# Patient Record
Sex: Female | Born: 1985 | Race: White | Hispanic: No | Marital: Single | Smoking: Current every day smoker
Health system: Southern US, Community
[De-identification: ages and names within clinical notes are randomized; demographics above are authoritative.]

## PROBLEM LIST (undated history)

## (undated) DIAGNOSIS — I10 Essential (primary) hypertension: Secondary | ICD-10-CM

## (undated) DIAGNOSIS — J189 Pneumonia, unspecified organism: Secondary | ICD-10-CM

---

## 2016-10-20 DIAGNOSIS — Z98891 History of uterine scar from previous surgery: Secondary | ICD-10-CM | POA: Insufficient documentation

## 2016-12-16 DIAGNOSIS — I10 Essential (primary) hypertension: Secondary | ICD-10-CM | POA: Insufficient documentation

## 2018-03-05 ENCOUNTER — Encounter (HOSPITAL_COMMUNITY): Payer: Self-pay

## 2018-03-05 DIAGNOSIS — F17200 Nicotine dependence, unspecified, uncomplicated: Secondary | ICD-10-CM | POA: Diagnosis not present

## 2018-03-05 DIAGNOSIS — Y929 Unspecified place or not applicable: Secondary | ICD-10-CM | POA: Insufficient documentation

## 2018-03-05 DIAGNOSIS — Y999 Unspecified external cause status: Secondary | ICD-10-CM | POA: Diagnosis not present

## 2018-03-05 DIAGNOSIS — J069 Acute upper respiratory infection, unspecified: Secondary | ICD-10-CM | POA: Diagnosis not present

## 2018-03-05 DIAGNOSIS — X58XXXA Exposure to other specified factors, initial encounter: Secondary | ICD-10-CM | POA: Insufficient documentation

## 2018-03-05 DIAGNOSIS — S90454A Superficial foreign body, right lesser toe(s), initial encounter: Secondary | ICD-10-CM | POA: Diagnosis present

## 2018-03-05 DIAGNOSIS — L03031 Cellulitis of right toe: Secondary | ICD-10-CM | POA: Diagnosis not present

## 2018-03-05 DIAGNOSIS — Y939 Activity, unspecified: Secondary | ICD-10-CM | POA: Diagnosis not present

## 2018-03-05 NOTE — ED Triage Notes (Signed)
Pt complains of flulike sx for two days, she also states that she has a toe ring on her right second toe that is now infected and embedded in her toe

## 2018-03-06 ENCOUNTER — Emergency Department (HOSPITAL_COMMUNITY)
Admission: EM | Admit: 2018-03-06 | Discharge: 2018-03-06 | Disposition: A | Discharge status: Home / Self Care | Payer: Medicaid Other | Attending: Emergency Medicine | Admitting: Emergency Medicine

## 2018-03-06 ENCOUNTER — Emergency Department (HOSPITAL_COMMUNITY): Payer: Medicaid Other

## 2018-03-06 DIAGNOSIS — J069 Acute upper respiratory infection, unspecified: Secondary | ICD-10-CM

## 2018-03-06 DIAGNOSIS — L03031 Cellulitis of right toe: Secondary | ICD-10-CM

## 2018-03-06 MED ORDER — SULFAMETHOXAZOLE-TRIMETHOPRIM 800-160 MG PO TABS
1.0000 | ORAL_TABLET | Freq: Once | ORAL | Status: AC
  Administered 2018-03-06: 1 via ORAL
  Filled 2018-03-06: qty 1

## 2018-03-06 MED ORDER — SULFAMETHOXAZOLE-TRIMETHOPRIM 800-160 MG PO TABS: 1.0000 | ORAL_TABLET | Freq: Two times a day (BID) | ORAL | 0 refills | Status: AC

## 2018-03-06 MED ORDER — LIDOCAINE HCL 2 % IJ SOLN
20.0000 mL | Freq: Once | INTRAMUSCULAR | Status: AC
  Administered 2018-03-06: 400 mg
  Filled 2018-03-06: qty 20

## 2018-03-06 MED ORDER — BACITRACIN ZINC 500 UNIT/GM EX OINT
TOPICAL_OINTMENT | CUTANEOUS | Status: AC
  Filled 2018-03-06: qty 1.8

## 2018-03-06 NOTE — ED Provider Notes (Signed)
[facility]-EMERGENCY DEPT Provider Note   CSN: [id number] Arrival date & time: 03/05/18  2128     History   Chief Complaint Chief Complaint  Patient presents with  . flulike sx  . Toe Pain    HPI Susan Duran is a 32 y.o. female.  Patient presents with 2 separate complaints.  Patient reports that she has been experiencing fever, chills, malaise, myalgias with nasal congestion and cough for 5 days.  She had a couple of episodes of diarrhea early in the process, but this has cleared.  No nausea, vomiting, abdominal pain.  Also complaining of right second toe pain.  Patient reports that she stubbed her toe several months ago.  She wears a ring on the right second toe, has noticed progressive swelling of the area and now the ring has become embedded in the skin of the toe.  She is concerned that it is infected.     History reviewed. No pertinent past medical history.  There are no active problems to display for this patient.   History reviewed. No pertinent surgical history.   OB History   None      Home Medications    Prior to Admission medications   Medication Sig Start Date End Date Taking? Authorizing Provider  acetaminophen (TYLENOL) 500 MG tablet Take 1,000 mg by mouth every 6 (six) hours as needed for moderate pain.   Yes [provider]  sulfamethoxazole-trimethoprim (BACTRIM DS,SEPTRA DS) 800-160 MG tablet Take 1 tablet by mouth 2 (two) times daily for 7 days. 03/06/18 03/13/18  Gilda Crease, MD    Family History History reviewed. No pertinent family history.  Social History Social History   Tobacco Use  . Smoking status: Current Every Day Smoker  . Smokeless tobacco: Never Used  Substance Use Topics  . Alcohol use: Never    Frequency: Never  . Drug use: Never     Allergies   Patient has no known allergies.   Review of Systems Review of Systems  Constitutional: Positive for fever.  HENT: Positive for  congestion.   Respiratory: Positive for cough.   Musculoskeletal: Positive for myalgias.  Skin: Positive for wound.  All other systems reviewed and are negative.    Physical Exam Updated Vital Signs BP 132/68   Pulse 89   Temp 98.6 F (37 C) (Oral)   Resp 18   SpO2 100%   Physical Exam  Constitutional: She is oriented to person, place, and time. She appears well-developed and well-nourished. No distress.  HENT:  Head: Normocephalic and atraumatic.  Right Ear: Hearing normal.  Left Ear: Hearing normal.  Nose: Nose normal.  Mouth/Throat: Oropharynx is clear and moist and mucous membranes are normal.  Eyes: Pupils are equal, round, and reactive to light. Conjunctivae and EOM are normal.  Neck: Normal range of motion. Neck supple.  Cardiovascular: Regular rhythm, S1 normal and S2 normal. Exam reveals no gallop and no friction rub.  No murmur heard. Pulmonary/Chest: Effort normal and breath sounds normal. No respiratory distress. She exhibits no tenderness.  Abdominal: Soft. Normal appearance and bowel sounds are normal. There is no hepatosplenomegaly. There is no tenderness. There is no rebound, no guarding, no tenderness at McBurney's point and negative Murphy's sign. No hernia.  Musculoskeletal: Normal range of motion.  Neurological: She is alert and oriented to person, place, and time. She has normal strength. No cranial nerve deficit or sensory deficit. Coordination normal. GCS eye subscore is 4. GCS verbal subscore is  5. GCS motor subscore is 6.  Skin: Skin is warm, dry and intact. No rash noted. No cyanosis.  Swelling and erythema of right second toe.  There is a ring present and the ring is completely overgrown by skin on lateral sides of the toe.  Psychiatric: She has a normal mood and affect. Her speech is normal and behavior is normal. Thought content normal.  Nursing note and vitals reviewed.      ED Treatments / Results  Labs (all labs ordered are listed, but only  abnormal results are displayed) Labs Reviewed - No data to display  EKG None  Radiology Dg Chest 2 View  Result Date: 03/06/2018 CLINICAL DATA:  Cough, flu-like symptoms. EXAM: CHEST - 2 VIEW COMPARISON:  None. FINDINGS: Mild peribronchial thickening. Heart and mediastinal contours are within normal limits. No focal opacities or effusions. No acute bony abnormality. IMPRESSION: Mild bronchitic changes. Electronically Signed   By: Charlett Nose M.D.   On: 03/06/2018 02:38   Dg Toe 2nd Right  Result Date: 03/06/2018 CLINICAL DATA:  Ring imbedded in toe. EXAM: RIGHT SECOND TOE COMPARISON:  None. FINDINGS: There is a ring on the right 2nd toe. No visible underlying acute bony abnormality or radiographic changes of osteomyelitis. IMPRESSION: No visible acute bony abnormality. Electronically Signed   By: Charlett Nose M.D.   On: 03/06/2018 02:39    Procedures .Foreign Body Removal Date/Time: 03/06/2018 3:29 AM Performed by: Gilda Crease, MD Authorized by: Gilda Crease, MD  Consent: Verbal consent obtained. Risks and benefits: risks, benefits and alternatives were discussed Consent given by: patient Patient understanding: patient states understanding of the procedure being performed Site marked: the operative site was marked Imaging studies: imaging studies available Patient identity confirmed: verbally with patient Time out: Immediately prior to procedure a "time out" was called to verify the correct patient, procedure, equipment, support staff and site/side marked as required. Body area: skin General location: lower extremity Location details: right second toe Anesthesia: digital block  Anesthesia: Local Anesthetic: lidocaine 2% without epinephrine Anesthetic total: 5 mL  Sedation: Patient sedated: no  Patient restrained: no Patient cooperative: yes Localization method: visualized Tendon involvement: none Complexity: simple 1 objects recovered. Objects  recovered: toe ring Post-procedure assessment: foreign body removed Patient tolerance: Patient tolerated the procedure well with no immediate complications Comments: Ring was cut with a ring cutter and then 2 pieces were removed from the toe   (including critical care time)  Medications Ordered in ED Medications  sulfamethoxazole-trimethoprim (BACTRIM DS,SEPTRA DS) 800-160 MG per tablet 1 tablet (has no administration in time range)  lidocaine (XYLOCAINE) 2 % (with pres) injection 400 mg (400 mg Infiltration Given by Other 03/06/18 1610)     Initial Impression / Assessment and Plan / ED Course  I have reviewed the triage vital signs and the nursing notes.  Pertinent labs & imaging results that were available during my care of the patient were reviewed by me and considered in my medical decision making (see chart for details).     Presents with upper respiratory infection symptoms.  Chest x-ray is clear, no evidence of pneumonia.  Symptoms suggest viral etiology.  No specific treatment necessary.  Patient also concerned about her right second toe.  She wears a toe ring on this toe and the ring has become embedded in the skin and now there is some redness associated.  A digital block was performed and the ring was removed.  Patient instructed on local wound care and  will prescribe a course of Bactrim.  Final Clinical Impressions(s) / ED Diagnoses   Final diagnoses:  Cellulitis of toe of right foot  Upper respiratory tract infection, unspecified type    ED Discharge Orders         Ordered    sulfamethoxazole-trimethoprim (BACTRIM DS,SEPTRA DS) 800-160 MG tablet  2 times daily     03/06/18 0327           Gilda Crease, MD 03/06/18 0330

## 2018-05-08 ENCOUNTER — Emergency Department (HOSPITAL_COMMUNITY)
Admission: EM | Admit: 2018-05-08 | Discharge: 2018-05-08 | Disposition: A | Discharge status: Home / Self Care | Payer: Medicaid Other | Attending: Emergency Medicine | Admitting: Emergency Medicine

## 2018-05-08 ENCOUNTER — Encounter (HOSPITAL_COMMUNITY): Payer: Self-pay

## 2018-05-08 ENCOUNTER — Emergency Department (HOSPITAL_COMMUNITY): Payer: Medicaid Other

## 2018-05-08 DIAGNOSIS — Y92002 Bathroom of unspecified non-institutional (private) residence single-family (private) house as the place of occurrence of the external cause: Secondary | ICD-10-CM | POA: Insufficient documentation

## 2018-05-08 DIAGNOSIS — Y93E1 Activity, personal bathing and showering: Secondary | ICD-10-CM | POA: Diagnosis not present

## 2018-05-08 DIAGNOSIS — X509XXA Other and unspecified overexertion or strenuous movements or postures, initial encounter: Secondary | ICD-10-CM | POA: Diagnosis not present

## 2018-05-08 DIAGNOSIS — F17200 Nicotine dependence, unspecified, uncomplicated: Secondary | ICD-10-CM | POA: Insufficient documentation

## 2018-05-08 DIAGNOSIS — S62636A Displaced fracture of distal phalanx of right little finger, initial encounter for closed fracture: Secondary | ICD-10-CM | POA: Insufficient documentation

## 2018-05-08 DIAGNOSIS — Y999 Unspecified external cause status: Secondary | ICD-10-CM | POA: Insufficient documentation

## 2018-05-08 DIAGNOSIS — S6991XA Unspecified injury of right wrist, hand and finger(s), initial encounter: Secondary | ICD-10-CM | POA: Diagnosis present

## 2018-05-08 MED ORDER — OXYCODONE-ACETAMINOPHEN 5-325 MG PO TABS
1.0000 | ORAL_TABLET | Freq: Once | ORAL | Status: AC
  Administered 2018-05-08: 1 via ORAL
  Filled 2018-05-08: qty 1

## 2018-05-08 MED ORDER — OXYCODONE-ACETAMINOPHEN 5-325 MG PO TABS: 1.0000 | ORAL_TABLET | ORAL | 0 refills | Status: AC | PRN

## 2018-05-08 NOTE — ED Provider Notes (Signed)
[facility]   CSN: [id number] Arrival date & time: 05/08/18  1155     History   Chief Complaint Chief Complaint  Patient presents with  . Finger Injury    HPI Susan Duran is a 32 y.o. female.  32 y/o female with no PMH presents to the ED with a chief complaint of right pinky finger pain x yesterday.Patient reports she was getting out of the shower when she grabbed a towel and the towel wrapped around her right pinky and seeing it "pop". She reports applying an ace bandage along with taking ibuprofen 80 0mg  but states no improvement in symptoms.She denies any other complaints.    The history is provided by the patient.    History reviewed. No pertinent past medical history.  There are no active problems to display for this patient.   History reviewed. No pertinent surgical history.   OB History   No obstetric history on file.      Home Medications    Prior to Admission medications   Medication Sig Start Date End Date Taking? Authorizing Provider  acetaminophen (TYLENOL) 500 MG tablet Take 1,000 mg by mouth every 6 (six) hours as needed for moderate pain.    [provider]  oxyCODONE-acetaminophen (PERCOCET/ROXICET) 5-325 MG tablet Take 1 tablet by mouth every 4 (four) hours as needed for up to 3 days for severe pain. 05/08/18 05/11/18  Claude MangesSoto, Harlen Danford, PA-C    Family History History reviewed. No pertinent family history.  Social History Social History   Tobacco Use  . Smoking status: Current Every Day Smoker  . Smokeless tobacco: Never Used  Substance Use Topics  . Alcohol use: Never    Frequency: Never  . Drug use: Never     Allergies   Patient has no known allergies.   Review of Systems Review of Systems  Constitutional: Negative for fever.  Musculoskeletal: Positive for arthralgias and myalgias.  Skin: Positive for color change.     Physical Exam Updated Vital Signs BP 135/84    Pulse 88   Temp 98 F (36.7 C) (Oral)   Resp 17   SpO2 98%   Physical Exam Vitals signs and nursing note reviewed.  Constitutional:      General: She is not in acute distress.    Appearance: She is well-developed.  HENT:     Head: Normocephalic and atraumatic.     Mouth/Throat:     Pharynx: No oropharyngeal exudate.  Eyes:     Pupils: Pupils are equal, round, and reactive to light.  Neck:     Musculoskeletal: Normal range of motion.  Cardiovascular:     Rate and Rhythm: Regular rhythm.     Heart sounds: Normal heart sounds.  Pulmonary:     Effort: Pulmonary effort is normal. No respiratory distress.     Breath sounds: Normal breath sounds.  Abdominal:     General: Bowel sounds are normal. There is no distension.     Palpations: Abdomen is soft.     Tenderness: There is no abdominal tenderness.  Musculoskeletal:        General: No deformity.     Right hand: She exhibits decreased range of motion, tenderness and swelling. She exhibits normal capillary refill, no deformity and no laceration. Normal sensation noted. Decreased strength noted. She exhibits thumb/finger opposition. She exhibits no wrist extension trouble.       Hands:     Right lower leg: No edema.  Left lower leg: No edema.  Skin:    General: Skin is warm and dry.  Neurological:     Mental Status: She is alert and oriented to person, place, and time.      ED Treatments / Results  Labs (all labs ordered are listed, but only abnormal results are displayed) Labs Reviewed - No data to display  EKG None  Radiology Dg Hand Complete Right  Result Date: 05/08/2018 CLINICAL DATA:  Small finger swelling and bruising after an injury last night. EXAM: RIGHT HAND - COMPLETE 3+ VIEW COMPARISON:  None. FINDINGS: Oblique fracture of the proximal phalanx small finger extends from the lateral diaphysis to the medial portion of the distal metaphysis. No separate underlying lesion is identified to suggest a  pathologic fracture. There is 8 mm of negative ulnar variance. 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 Possible flattening and irregularity of the scaphoid, versus rotation of the scaphoid. Suspected 5 mm cyst or geode in the proximal pole of the scaphoid. Patient is at risk for [facility] formation. IMPRESSION: 1. Acute oblique fracture distally in the proximal phalanx small finger. 2. 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 Negative ulnar variance may be a predisposing factor. Possible flattening of the proximal pole of the scaphoid. High risk for development of scapholunate advanced collapse. Electronically Signed   By: Gaylyn RongWalter  Liebkemann M.D.   On: 05/08/2018 13:05    Procedures Procedures (including critical care time)  Medications Ordered in ED Medications  oxyCODONE-acetaminophen (PERCOCET/ROXICET) 5-325 MG per tablet 1 tablet (1 tablet Oral Given 05/08/18 1256)     Initial Impression / Assessment and Plan / ED Course  I have reviewed the triage vital signs and the nursing notes.  Pertinent labs & imaging results that were available during my care of the patient were reviewed by me and considered in my medical decision making (see chart for details).    Patient presents after wrapping her finger on a towel yesterday getting out of the shower and hearing it pop. She reports taking ibuprofen for pain but no relieve in symptoms. Reports swelling along with bruising al over right hand. Will obtain right DG hand to r/o any fracture.  DG hand showed: 1. Acute oblique fracture distally in the proximal phalanx small  finger.  2. Flattening and irregularity of the lunate suspicious for  Kienbock's lunatomalacia. Negative ulnar variance may be a  predisposing factor. Possible flattening of the proximal pole of the  scaphoid. High risk for development of scapholunate advanced  collapse.     Patient provided pain  medication for pain, hydrocodone. Will place patient on ulnar gutter and have her follow up with Dr. Linna CapriceSwinteck , arm sling provided for comfort. Return precautions discussed at length.   Final Clinical Impressions(s) / ED Diagnoses   Final diagnoses:  Closed displaced fracture of distal phalanx of right little finger, initial encounter    ED Discharge Orders         Ordered    oxyCODONE-acetaminophen (PERCOCET/ROXICET) 5-325 MG tablet  Every 4 hours PRN     05/08/18 1350           Claude MangesSoto, Chandelle Harkey, PA-C 05/08/18 1406    Tilden Fossaees, Elizabeth, MD 05/08/18 1730

## 2018-05-08 NOTE — ED Triage Notes (Signed)
Pt states she broke her pinky last night slipping getting out of the shower

## 2018-05-08 NOTE — ED Notes (Signed)
Patient transported to X-ray 

## 2018-05-08 NOTE — Discharge Instructions (Signed)
I have provided the number to Dr. Linna CapriceSwinteck please schedule an appointment for follow up. I have also provided medication for your pain, please keep you splint clean and dry.

## 2018-05-08 NOTE — ED Notes (Signed)
Pt stable, ambulatory, states understanding of discharge instructions 

## 2018-05-08 NOTE — Progress Notes (Signed)
Orthopedic Tech Progress Note Patient Details:  Susan Duran 11-Aug-1985 [id number]  Ortho Devices Type of Ortho Device: Ulna gutter splint Ortho Device/Splint Location: rue Ortho Device/Splint Interventions: Ordered, Adjustment, Application   Post Interventions Patient Tolerated: Well Instructions Provided: Care of device, Adjustment of device   [name], Kaysee Hergert J 05/08/2018, 2:08 PM

## 2018-05-27 ENCOUNTER — Emergency Department (HOSPITAL_COMMUNITY)
Admission: EM | Admit: 2018-05-27 | Discharge: 2018-05-27 | Disposition: A | Discharge status: Home / Self Care | Payer: Medicaid Other | Attending: Emergency Medicine | Admitting: Emergency Medicine

## 2018-05-27 DIAGNOSIS — R0981 Nasal congestion: Secondary | ICD-10-CM | POA: Diagnosis not present

## 2018-05-27 DIAGNOSIS — J111 Influenza due to unidentified influenza virus with other respiratory manifestations: Secondary | ICD-10-CM | POA: Diagnosis not present

## 2018-05-27 DIAGNOSIS — M7918 Myalgia, other site: Secondary | ICD-10-CM | POA: Insufficient documentation

## 2018-05-27 DIAGNOSIS — N12 Tubulo-interstitial nephritis, not specified as acute or chronic: Secondary | ICD-10-CM

## 2018-05-27 DIAGNOSIS — F17210 Nicotine dependence, cigarettes, uncomplicated: Secondary | ICD-10-CM | POA: Insufficient documentation

## 2018-05-27 DIAGNOSIS — R6889 Other general symptoms and signs: Secondary | ICD-10-CM

## 2018-05-27 DIAGNOSIS — R509 Fever, unspecified: Secondary | ICD-10-CM | POA: Diagnosis present

## 2018-05-27 DIAGNOSIS — N10 Acute tubulo-interstitial nephritis: Secondary | ICD-10-CM | POA: Diagnosis not present

## 2018-05-27 LAB — URINALYSIS, MICROSCOPIC (REFLEX)

## 2018-05-27 LAB — URINALYSIS, ROUTINE W REFLEX MICROSCOPIC
Bilirubin Urine: NEGATIVE
GLUCOSE, UA: NEGATIVE mg/dL
HGB URINE DIPSTICK: NEGATIVE
Ketones, ur: NEGATIVE mg/dL
Nitrite: POSITIVE — AB
Protein, ur: NEGATIVE mg/dL
Specific Gravity, Urine: 1.01 (ref 1.005–1.030)
pH: 7.5 (ref 5.0–8.0)

## 2018-05-27 LAB — PREGNANCY, URINE: Preg Test, Ur: NEGATIVE

## 2018-05-27 MED ORDER — BENZONATATE 100 MG PO CAPS: 200.0000 mg | ORAL_CAPSULE | Freq: Three times a day (TID) | ORAL | 0 refills | Status: DC

## 2018-05-27 MED ORDER — ACETAMINOPHEN 325 MG PO TABS: 650.0000 mg | ORAL_TABLET | Freq: Four times a day (QID) | ORAL | 0 refills | Status: AC | PRN

## 2018-05-27 MED ORDER — FLUTICASONE PROPIONATE 50 MCG/ACT NA SUSP: 1.0000 | Freq: Every day | NASAL | 2 refills | Status: DC

## 2018-05-27 MED ORDER — CEPHALEXIN 500 MG PO CAPS: 500.0000 mg | ORAL_CAPSULE | Freq: Four times a day (QID) | ORAL | 0 refills | Status: AC

## 2018-05-27 NOTE — ED Triage Notes (Signed)
Pt here with c/o flu like symptoms times 4 days along with back pain

## 2018-05-27 NOTE — ED Notes (Signed)
Pt refused discharge vitals because her ride was here

## 2018-05-27 NOTE — Discharge Instructions (Signed)
Please complete the entire course of antibiotics regardless of symptom improvement to prevent worsening or recurrence of your infection. Return to ED for worsening symptoms, increased fevers, injuries or falls, chest pain, shortness of breath.

## 2018-05-27 NOTE — ED Notes (Signed)
Pt ambulating independently to provide urine sample at this time.

## 2018-05-27 NOTE — ED Notes (Signed)
Patient verbalizes understanding of discharge instructions. Opportunity for questioning and answers were provided. Armband removed by staff, pt discharged from ED.  

## 2018-05-27 NOTE — ED Provider Notes (Signed)
[facility] EMERGENCY DEPARTMENT Provider Note   CSN: [id number] Arrival date & time: 05/27/18  1017     History   Chief Complaint Chief Complaint  Patient presents with  . Influenza    HPI Susan Duran is a 33 y.o. female who presents to ED for multiple complaints. Her first complaint is influenza-like symptoms for the past 4 days.  She notes fever with T-max 101, generalized body aches, sinus pain and pressure, rhinorrhea, cough productive with mucus.  Sick contacts including her son with similar symptoms last week.  She has been taking Tylenol with no improvement in her symptoms.  She denies any recent travel, shortness of breath, chest pain. Her next complaint is right-sided lower back pain for the past 3 days.  Reports pain worse with movement, palpation.  Denies any urinary symptoms.  No improvement with ibuprofen.  She took Tylenol last 1-1/2 hours ago.  Denies any injuries or falls, recent heavy lifting, prior back surgeries, history of cancer, treatment IV drug use, loss of bowel or bladder function.  HPI  No past medical history on file.  There are no active problems to display for this patient.   No past surgical history on file.   OB History   No obstetric history on file.      Home Medications    Prior to Admission medications   Medication Sig Start Date End Date Taking? Authorizing Provider  acetaminophen (TYLENOL) 325 MG tablet Take 2 tablets (650 mg total) by mouth every 6 (six) hours as needed. 05/27/18   Lorilei Horan, PA-C  benzonatate (TESSALON) 100 MG capsule Take 2 capsules (200 mg total) by mouth every 8 (eight) hours. 05/27/18   Sariyah Corcino, PA-C  cephALEXin (KEFLEX) 500 MG capsule Take 1 capsule (500 mg total) by mouth 4 (four) times daily for 10 days. 05/27/18 06/06/18  Cipriano Millikan, PA-C  fluticasone (FLONASE) 50 MCG/ACT nasal spray Place 1 spray into both nostrils daily. 05/27/18   Dietrich Pates, PA-C    Family History No family  history on file.  Social History Social History   Tobacco Use  . Smoking status: Current Every Day Smoker  . Smokeless tobacco: Never Used  Substance Use Topics  . Alcohol use: Never    Frequency: Never  . Drug use: Never     Allergies   Patient has no known allergies.   Review of Systems Review of Systems  Constitutional: Positive for chills and fever.  HENT: Positive for congestion and rhinorrhea.   Respiratory: Positive for cough.   Gastrointestinal: Negative for nausea and vomiting.  Genitourinary: Negative for dysuria.  Musculoskeletal: Positive for back pain.     Physical Exam Updated Vital Signs BP (!) 123/91 (BP Location: Left Arm)   Pulse (!) 102   Temp 98.5 F (36.9 C) (Oral)   Resp 16   SpO2 100%   Physical Exam Vitals signs and nursing note reviewed.  Constitutional:      General: She is not in acute distress.    Appearance: She is well-developed. She is not diaphoretic.  HENT:     Head: Normocephalic and atraumatic.     Right Ear: A middle ear effusion is present.     Left Ear: A middle ear effusion is present.     Nose: Rhinorrhea present.     Mouth/Throat:     Pharynx: Oropharynx is clear. Uvula midline.     Tonsils: Swelling: 0 on the right. 0 on the left.  Eyes:  General: No scleral icterus.    Conjunctiva/sclera: Conjunctivae normal.  Neck:     Musculoskeletal: Normal range of motion.  Cardiovascular:     Rate and Rhythm: Normal rate and regular rhythm.     Heart sounds: Normal heart sounds.  Pulmonary:     Effort: Pulmonary effort is normal. No respiratory distress.     Breath sounds: Normal breath sounds.  Abdominal:     Tenderness: There is no abdominal tenderness.     Comments: R CVA.  Musculoskeletal:     Comments: No midline spinal tenderness present in lumbar, thoracic or cervical spine. No step-off palpated. No visible bruising, edema or temperature change noted. No objective signs of numbness present. No saddle  anesthesia. 2+ DP pulses bilaterally. Sensation intact to light touch. Strength 5/5 in bilateral lower extremities.  Skin:    Findings: No rash.  Neurological:     Mental Status: She is alert.      ED Treatments / Results  Labs (all labs ordered are listed, but only abnormal results are displayed) Labs Reviewed  URINALYSIS, ROUTINE W REFLEX MICROSCOPIC - Abnormal; Notable for the following components:      Result Value   APPearance CLOUDY (*)    Nitrite POSITIVE (*)    Leukocytes, UA TRACE (*)    All other components within normal limits  URINALYSIS, MICROSCOPIC (REFLEX) - Abnormal; Notable for the following components:   Bacteria, UA MANY (*)    All other components within normal limits  URINE CULTURE  PREGNANCY, URINE    EKG None  Radiology No results found.  Procedures Procedures (including critical care time)  Medications Ordered in ED Medications - No data to display   Initial Impression / Assessment and Plan / ED Course  I have reviewed the triage vital signs and the nursing notes.  Pertinent labs & imaging results that were available during my care of the patient were reviewed by me and considered in my medical decision making (see chart for details).      Patient with symptoms consistent with influenza.  Vitals are stable, low-grade fever.  No signs of dehydration, tolerating PO's.  Lungs are clear. Due to patient's presentation and physical exam a chest x-ray was not ordered bc likely diagnosis of flu.  Discussed the cost versus benefit of Tamiflu treatment with the patient.  The patient understands that symptoms are greater than the recommended 24-48 hour window of treatment.  Patient will be discharged with instructions to orally hydrate, rest, and use over-the-counter medications such as anti-inflammatories ibuprofen and Aleve for muscle aches and Tylenol for fever, and symptomatic treatment. We will also treat for pyelonephritis as she has right CVA  tenderness and urine positive for nitrite, leukocytes and bacteria, and the back pain and fever could be attributed by this.  Will advise her to complete the entire course of antibiotics regardless of symptom improvement.  Patient is hemodynamically stable, in NAD, and able to ambulate in the ED. Evaluation does not show pathology that would require ongoing emergent intervention or inpatient treatment. I explained the diagnosis to the patient. Pain has been managed and has no complaints prior to discharge. Patient is comfortable with above plan and is stable for discharge at this time. All questions were answered prior to disposition. Strict return precautions for returning to the ED were discussed. Encouraged follow up with PCP.    Portions of this note were generated with Scientist, clinical (histocompatibility and immunogenetics)Dragon dictation software. Dictation errors may occur despite best attempts at proofreading.  Final Clinical Impressions(s) / ED Diagnoses   Final diagnoses:  Flu-like symptoms  Pyelonephritis    ED Discharge Orders         Ordered    cephALEXin (KEFLEX) 500 MG capsule  4 times daily     05/27/18 1255    acetaminophen (TYLENOL) 325 MG tablet  Every 6 hours PRN     05/27/18 1255    fluticasone (FLONASE) 50 MCG/ACT nasal spray  Daily     05/27/18 1255    benzonatate (TESSALON) 100 MG capsule  Every 8 hours     05/27/18 1255           Dietrich Pates, PA-C 05/27/18 1256    Linwood Dibbles, MD 05/29/18 [phone]

## 2018-05-29 LAB — URINE CULTURE

## 2018-05-30 ENCOUNTER — Telehealth: Payer: Self-pay

## 2018-05-30 NOTE — Telephone Encounter (Signed)
Post ED Visit - Positive Culture Follow-up  Culture report reviewed by antimicrobial stewardship pharmacist:  []  Enzo Bi, Pharm.D. []  Celedonio Miyamoto, Pharm.D., BCPS AQ-ID []  Garvin Fila, Pharm.D., BCPS []  Georgina Pillion, [address].D., BCPS []  [place], [address].D., BCPS, AAHIVP []  Estella Husk, Pharm.D., BCPS, AAHIVP []  Lysle Pearl, PharmD, BCPS []  Phillips Climes, PharmD, BCPS []  Agapito Games, PharmD, BCPS []  Verlan Friends, PharmD C Andi Devon Pharm D Positive urine culture Treated with Cephalexin, organism sensitive to the same and no further patient follow-up is required at this time.  Jerry Caras 05/30/2018, 8:40 AM

## 2018-10-03 ENCOUNTER — Other Ambulatory Visit: Payer: Self-pay

## 2018-10-03 ENCOUNTER — Emergency Department (HOSPITAL_COMMUNITY): Payer: Medicaid Other

## 2018-10-03 ENCOUNTER — Emergency Department (HOSPITAL_COMMUNITY)
Admission: EM | Admit: 2018-10-03 | Discharge: 2018-10-03 | Disposition: A | Discharge status: Home / Self Care | Payer: Medicaid Other | Attending: Emergency Medicine | Admitting: Emergency Medicine

## 2018-10-03 ENCOUNTER — Encounter (HOSPITAL_COMMUNITY): Payer: Self-pay | Admitting: Emergency Medicine

## 2018-10-03 DIAGNOSIS — G8911 Acute pain due to trauma: Secondary | ICD-10-CM | POA: Insufficient documentation

## 2018-10-03 DIAGNOSIS — R05 Cough: Secondary | ICD-10-CM | POA: Diagnosis not present

## 2018-10-03 DIAGNOSIS — J189 Pneumonia, unspecified organism: Secondary | ICD-10-CM | POA: Diagnosis not present

## 2018-10-03 DIAGNOSIS — R0781 Pleurodynia: Secondary | ICD-10-CM | POA: Insufficient documentation

## 2018-10-03 DIAGNOSIS — R0602 Shortness of breath: Secondary | ICD-10-CM | POA: Diagnosis present

## 2018-10-03 DIAGNOSIS — F17210 Nicotine dependence, cigarettes, uncomplicated: Secondary | ICD-10-CM | POA: Diagnosis not present

## 2018-10-03 HISTORY — DX: Pneumonia, unspecified organism: J18.9

## 2018-10-03 MED ORDER — DOXYCYCLINE HYCLATE 100 MG PO TABS
100.0000 mg | ORAL_TABLET | Freq: Once | ORAL | Status: AC
  Administered 2018-10-03: 20:00:00 100 mg via ORAL
  Filled 2018-10-03: qty 1

## 2018-10-03 MED ORDER — DOXYCYCLINE HYCLATE 100 MG PO CAPS: 100.0000 mg | ORAL_CAPSULE | Freq: Two times a day (BID) | ORAL | 0 refills | Status: DC

## 2018-10-03 NOTE — ED Notes (Addendum)
Pt ambulate inside the room for 3 minutes. Steady gait. SpO2 100

## 2018-10-03 NOTE — Discharge Instructions (Signed)
Evaluated today for cough and shortness of breath.  Your chest x-ray showed pneumonia.  You decided blood work and CT scan.  I have given you an antibiotic.  Please take as prescribed.  Please follow-up if you develop fever, worsening shortness of breath, coughing up blood.  Return to the ED for any new or worsening symptoms.

## 2018-10-03 NOTE — ED Notes (Signed)
Patient transported to X-ray 

## 2018-10-03 NOTE — ED Notes (Signed)
Pt returned from radiology at this time.  
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
## 2019-11-19 ENCOUNTER — Emergency Department (HOSPITAL_COMMUNITY): Payer: Medicaid Other

## 2019-11-19 ENCOUNTER — Emergency Department (HOSPITAL_COMMUNITY)
Admission: EM | Admit: 2019-11-19 | Discharge: 2019-11-19 | Disposition: A | Discharge status: Home / Self Care | Payer: Medicaid Other | Attending: Emergency Medicine | Admitting: Emergency Medicine

## 2019-11-19 ENCOUNTER — Encounter (HOSPITAL_COMMUNITY): Payer: Self-pay | Admitting: Emergency Medicine

## 2019-11-19 DIAGNOSIS — Y999 Unspecified external cause status: Secondary | ICD-10-CM | POA: Diagnosis not present

## 2019-11-19 DIAGNOSIS — Y929 Unspecified place or not applicable: Secondary | ICD-10-CM | POA: Insufficient documentation

## 2019-11-19 DIAGNOSIS — S0990XA Unspecified injury of head, initial encounter: Secondary | ICD-10-CM | POA: Diagnosis not present

## 2019-11-19 DIAGNOSIS — S01112A Laceration without foreign body of left eyelid and periocular area, initial encounter: Secondary | ICD-10-CM | POA: Insufficient documentation

## 2019-11-19 DIAGNOSIS — Y939 Activity, unspecified: Secondary | ICD-10-CM | POA: Insufficient documentation

## 2019-11-19 DIAGNOSIS — F17210 Nicotine dependence, cigarettes, uncomplicated: Secondary | ICD-10-CM | POA: Insufficient documentation

## 2019-11-19 DIAGNOSIS — M25572 Pain in left ankle and joints of left foot: Secondary | ICD-10-CM | POA: Insufficient documentation

## 2019-11-19 DIAGNOSIS — M79672 Pain in left foot: Secondary | ICD-10-CM

## 2019-11-19 DIAGNOSIS — S0181XA Laceration without foreign body of other part of head, initial encounter: Secondary | ICD-10-CM

## 2019-11-19 DIAGNOSIS — S0083XA Contusion of other part of head, initial encounter: Secondary | ICD-10-CM

## 2019-11-19 MED ORDER — ACETAMINOPHEN 500 MG PO TABS: 1000.0000 mg | ORAL_TABLET | Freq: Once | ORAL | Status: DC

## 2019-11-19 MED ORDER — LIDOCAINE-EPINEPHRINE (PF) 2 %-1:200000 IJ SOLN
10.0000 mL | Freq: Once | INTRAMUSCULAR | Status: AC
  Administered 2019-11-19: 10 mL

## 2019-11-19 MED ORDER — METHOCARBAMOL 500 MG PO TABS
500.0000 mg | ORAL_TABLET | Freq: Two times a day (BID) | ORAL | 0 refills | Status: DC
Start: 2019-11-19 — End: 2020-05-20
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
## 2020-05-20 ENCOUNTER — Ambulatory Visit (HOSPITAL_COMMUNITY)
Admission: EM | Admit: 2020-05-20 | Discharge: 2020-05-20 | Disposition: A | Discharge status: Home / Self Care | Payer: Medicaid Other | Attending: Student | Admitting: Student

## 2020-05-20 ENCOUNTER — Other Ambulatory Visit: Payer: Self-pay

## 2020-05-20 ENCOUNTER — Encounter (HOSPITAL_COMMUNITY): Payer: Self-pay | Admitting: Emergency Medicine

## 2020-05-20 DIAGNOSIS — J069 Acute upper respiratory infection, unspecified: Secondary | ICD-10-CM | POA: Diagnosis not present

## 2020-05-20 DIAGNOSIS — B372 Candidiasis of skin and nail: Secondary | ICD-10-CM | POA: Diagnosis not present

## 2020-05-20 DIAGNOSIS — N3001 Acute cystitis with hematuria: Secondary | ICD-10-CM | POA: Diagnosis not present

## 2020-05-20 DIAGNOSIS — J209 Acute bronchitis, unspecified: Secondary | ICD-10-CM | POA: Diagnosis not present

## 2020-05-20 DIAGNOSIS — Z20822 Contact with and (suspected) exposure to covid-19: Secondary | ICD-10-CM | POA: Insufficient documentation

## 2020-05-20 HISTORY — DX: Essential (primary) hypertension: I10

## 2020-05-20 LAB — RESP PANEL BY RT-PCR (FLU A&B, COVID) ARPGX2
Influenza A by PCR: NEGATIVE
Influenza B by PCR: NEGATIVE
SARS Coronavirus 2 by RT PCR: NEGATIVE

## 2020-05-20 LAB — POCT URINALYSIS DIPSTICK, ED / UC
Glucose, UA: NEGATIVE mg/dL
Leukocytes,Ua: NEGATIVE
Nitrite: POSITIVE — AB
Protein, ur: 100 mg/dL — AB
Specific Gravity, Urine: >=1.03 (ref 1.005–1.030)
Urobilinogen, UA: 0.2 mg/dL (ref 0.0–1.0)
pH: 6 (ref 5.0–8.0)

## 2020-05-20 LAB — POC URINE PREG, ED: Preg Test, Ur: NEGATIVE

## 2020-05-20 MED ORDER — BENZONATATE 100 MG PO CAPS
100.0000 mg | ORAL_CAPSULE | Freq: Three times a day (TID) | ORAL | 0 refills | Status: AC
Start: 2020-05-20 — End: ?

## 2020-05-20 MED ORDER — NITROFURANTOIN MONOHYD MACRO 100 MG PO CAPS: 100.0000 mg | ORAL_CAPSULE | Freq: Two times a day (BID) | ORAL | 0 refills | Status: AC

## 2020-05-20 MED ORDER — NYSTATIN 100000 UNIT/GM EX CREA: TOPICAL_CREAM | CUTANEOUS | 0 refills | Status: AC

## 2020-05-20 MED ORDER — PHENAZOPYRIDINE HCL 95 MG PO TABS: 95.0000 mg | ORAL_TABLET | Freq: Three times a day (TID) | ORAL | 0 refills | Status: AC | PRN

## 2020-05-20 MED ORDER — PREDNISONE 20 MG PO TABS: 40.0000 mg | ORAL_TABLET | Freq: Every day | ORAL | 0 refills | Status: AC

## 2020-05-20 NOTE — ED Provider Notes (Addendum)
[place]    CSN: [id number] Arrival date & time: 05/20/20  0810      History   Chief Complaint Chief Complaint  Patient presents with  . URI  . Urinary Tract Infection    HPI Susan Duran is a 35 y.o. female Presenting for URI symptoms for 2 days; UTI symptoms x7 days; rash x5 days. History of pneumonia, pyelonephritis. -Endorses productive cough, congestion, fevers at home. Fevers up to 101.2 at home, controlled on Tylenol. Coughing up yellow phlegm. Denies  n/v/d, shortness of breath, chest pain, facial pain, teeth pain, headaches, sore throat, loss of taste/smell, swollen lymph nodes, ear pain.  -Endorses 7 days of dysuria, frequency, occ lower abd pain, bilateral lower back pain.  Denies hematuria, , urgency,  n/v/d, abdnormal vaginal discharge.  -Rash on stomach under fold of skin. Not itchy or painful.  Denies chest pain, shortness of breath, confusion, high fevers.  Fully vaccinated for covid-19.   HPI  Past Medical History:  Diagnosis Date  . Hypertension   . Pneumonia     There are no problems to display for this patient.   Past Surgical History:  Procedure Laterality Date  . CESAREAN SECTION      OB History   No obstetric history on file.      Home Medications    Prior to Admission medications   Medication Sig Start Date End Date Taking? Authorizing Provider  acetaminophen (TYLENOL) 325 MG tablet Take 2 tablets (650 mg total) by mouth every 6 (six) hours as needed. 05/27/18  Yes Khatri, Hina, PA-C  benzonatate (TESSALON) 100 MG capsule Take 1 capsule (100 mg total) by mouth every 8 (eight) hours. 05/20/20  Yes Hazel Sams, PA-C  nitrofurantoin, macrocrystal-monohydrate, (MACROBID) 100 MG capsule Take 1 capsule (100 mg total) by mouth 2 (two) times daily. 05/20/20  Yes Hazel Sams, PA-C  nystatin cream (MYCOSTATIN) Apply to affected area 2 times daily 05/20/20  Yes Hazel Sams, PA-C  phenazopyridine (PYRIDIUM) 95 MG tablet Take 1  tablet (95 mg total) by mouth 3 (three) times daily as needed for pain. 05/20/20  Yes Hazel Sams, PA-C  predniSONE (DELTASONE) 20 MG tablet Take 2 tablets (40 mg total) by mouth daily for 5 days. 05/20/20 05/25/20 Yes Hazel Sams, PA-C  fluticasone (FLONASE) 50 MCG/ACT nasal spray Place 1 spray into both nostrils daily. 05/27/18 05/20/20  Delia Heady, PA-C    Family History History reviewed. No pertinent family history.  Social History Social History   Tobacco Use  . Smoking status: Current Every Day Smoker    Packs/day: 0.25    Types: Cigarettes  . Smokeless tobacco: Never Used  Substance Use Topics  . Alcohol use: Never    Comment: occ  . Drug use: Yes    Types: Marijuana     Allergies   Patient has no known allergies.   Review of Systems Review of Systems  Constitutional: Positive for chills and fever. Negative for appetite change and diaphoresis.  HENT: Positive for congestion. Negative for ear pain, rhinorrhea, sinus pressure, sinus pain and sore throat.   Eyes: Negative for redness and visual disturbance.  Respiratory: Positive for cough. Negative for chest tightness, shortness of breath and wheezing.   Cardiovascular: Negative for chest pain and palpitations.  Gastrointestinal: Positive for abdominal pain. Negative for blood in stool, constipation, diarrhea, nausea and vomiting.  Genitourinary: Positive for dysuria and frequency. Negative for decreased urine volume, difficulty urinating, flank pain, genital sores, hematuria  and urgency.  Musculoskeletal: Negative for back pain and myalgias.  Skin: Positive for rash.  Neurological: Negative for dizziness, weakness, light-headedness and headaches.  Psychiatric/Behavioral: Negative for confusion.  All other systems reviewed and are negative.    Physical Exam Triage Vital Signs ED Triage Vitals  Enc Vitals Group     BP      Pulse      Resp      Temp      Temp src      SpO2      Weight      Height       Head Circumference      Peak Flow      Pain Score      Pain Loc      Pain Edu?      Excl. in GC?    No data found.  Updated Vital Signs BP 127/82 (BP Location: Right Arm)   Pulse 92   Temp 98.2 F (36.8 C) (Oral)   Resp (!) 22   SpO2 95%   Visual Acuity Right Eye Distance:   Left Eye Distance:   Bilateral Distance:    Right Eye Near:   Left Eye Near:    Bilateral Near:     Physical Exam Vitals reviewed.  Constitutional:      General: She is not in acute distress.    Appearance: Normal appearance. She is not ill-appearing.  HENT:     Head: Normocephalic and atraumatic.     Right Ear: Hearing, tympanic membrane, ear canal and external ear normal. No swelling or tenderness. There is no impacted cerumen. No mastoid tenderness. Tympanic membrane is not perforated, erythematous, retracted or bulging.     Left Ear: Hearing, tympanic membrane, ear canal and external ear normal. No swelling or tenderness. There is no impacted cerumen. No mastoid tenderness. Tympanic membrane is not perforated, erythematous, retracted or bulging.     Nose:     Right Sinus: No maxillary sinus tenderness or frontal sinus tenderness.     Left Sinus: No maxillary sinus tenderness or frontal sinus tenderness.     Mouth/Throat:     Mouth: Mucous membranes are moist.     Pharynx: Uvula midline. No oropharyngeal exudate or posterior oropharyngeal erythema.     Tonsils: No tonsillar exudate.  Cardiovascular:     Rate and Rhythm: Normal rate and regular rhythm.     Heart sounds: Normal heart sounds.  Pulmonary:     Effort: Pulmonary effort is normal.     Breath sounds: Normal air entry. Rhonchi present. No wheezing or rales.     Comments: Scattered rhonchi throughout Chest:     Chest wall: No tenderness.  Abdominal:     General: Abdomen is flat. Bowel sounds are normal. There is no distension.     Palpations: Abdomen is soft. There is no mass.     Tenderness: There is no abdominal tenderness. There  is no right CVA tenderness, left CVA tenderness, guarding or rebound.  Lymphadenopathy:     Cervical: No cervical adenopathy.  Skin:         Comments: Erythematous rash beneath pannus  Neurological:     General: No focal deficit present.     Mental Status: She is alert and oriented to person, place, and time. Mental status is at baseline.  Psychiatric:        Attention and Perception: Attention and perception normal.        Mood and Affect: Mood and  affect normal.        Behavior: Behavior normal. Behavior is cooperative.        Thought Content: Thought content normal.        Judgment: Judgment normal.      UC Treatments / Results  Labs (all labs ordered are listed, but only abnormal results are displayed) Labs Reviewed  POCT URINALYSIS DIPSTICK, ED / UC - Abnormal; Notable for the following components:      Result Value   Bilirubin Urine SMALL (*)    Ketones, ur TRACE (*)    Hgb urine dipstick TRACE (*)    Protein, ur 100 (*)    Nitrite POSITIVE (*)    All other components within normal limits  RESP PANEL BY RT-PCR (FLU A&B, COVID) ARPGX2  URINE CULTURE  POC URINE PREG, ED    EKG   Radiology No results found.  Procedures Procedures (including critical care time)  Medications Ordered in UC Medications - No data to display  Initial Impression / Assessment and Plan / UC Course  I have reviewed the triage vital signs and the nursing notes.  Pertinent labs & imaging results that were available during my care of the patient were reviewed by me and considered in my medical decision making (see chart for details).     Urine pregnancy negative.  UA with trace blood, positive nitrite. Given history of pyelo, culture sent.   Covid and influenza tests sent today. Patient is fully vaccinated for covid-19. Isolation precautions per CDC guidelines until negative result. Symptomatic relief with OTC Mucinex, Nyquil, etc. Return precautions- new/worsening fevers/chills,  shortness of breath, chest pain, abd pain, etc.    -For your UTI, start the antibiotic (Macrobid). Take two pills a day (one in the morning, one in the evening) for 5 days. You can also take the azo (phenazopyridine) as needed for urinary discomfort.  -For your bronchitis, take two pills of prednisone in the morning x5 days. Use tessalon as needed for cough. She does not have diabetes.  -For your yeast rash, use the Nystatin cream 1-2x daily for 1-2 weeks.    afebrile nontachycardic nontachypneic, oxygenating well on room air.  We are currently awaiting result of your PCR covid-19 test. This typically comes back in 1-2 days. We'll call you if the result is positive. Otherwise, the result will be sent electronically to your MyChart. You can also call this clinic and ask for your result via telephone.   Please isolate at home while awaiting these results. If your test is positive for Covid-19, continue to isolate at home for 5 days if you have mild symptoms, or a total of 10 days from symptom onset if you have more severe symptoms. If you quarantine for a shorter period of time (i.e. 5 days), make sure to wear a mask until day 10 of symptoms. Treat your symptoms at home with OTC remedies like tylenol/ibuprofen, mucinex, nyquil, etc. Seek medical attention if you develop high fevers, chest pain, shortness of breath, ear pain, facial pain, etc. Make sure to get up and move around every 2-3 hours while convalescing to help prevent blood clots. Drink plenty of fluids, and rest as much as possible.  Past ED note for pyelo reviewed today.  Final Clinical Impressions(s) / UC Diagnoses   Final diagnoses:  Acute cystitis with hematuria  Candida infection of flexural skin  Acute upper respiratory infection  Acute bronchitis, unspecified organism     Discharge Instructions     -For your  UTI, start the antibiotic (Macrobid). Take two pills a day (one in the morning, one in the evening) for 5 days. You  can also take the azo (phenazopyridine) as needed for urinary discomfort.  -For your bronchitis, take two pills of prednisone in the morning x5 days. Use tessalon as needed for cough.  -For your yeast rash, use the Nystatin cream 1-2x daily for 1-2 weeks.   We are currently awaiting result of your PCR covid-19 test. This typically comes back in 1-2 days. We'll call you if the result is positive. Otherwise, the result will be sent electronically to your MyChart. You can also call this clinic and ask for your result via telephone.   Please isolate at home while awaiting these results. If your test is positive for Covid-19, continue to isolate at home for 5 days if you have mild symptoms, or a total of 10 days from symptom onset if you have more severe symptoms. If you quarantine for a shorter period of time (i.e. 5 days), make sure to wear a mask until day 10 of symptoms. Treat your symptoms at home with OTC remedies like tylenol/ibuprofen, mucinex, nyquil, etc. Seek medical attention if you develop high fevers, chest pain, shortness of breath, ear pain, facial pain, etc. Make sure to get up and move around every 2-3 hours while convalescing to help prevent blood clots. Drink plenty of fluids, and rest as much as possible.     ED Prescriptions    Medication Sig Dispense Auth. Provider   nitrofurantoin, macrocrystal-monohydrate, (MACROBID) 100 MG capsule Take 1 capsule (100 mg total) by mouth 2 (two) times daily. 10 capsule Rhys Martini, PA-C   benzonatate (TESSALON) 100 MG capsule Take 1 capsule (100 mg total) by mouth every 8 (eight) hours. 21 capsule Rhys Martini, PA-C   predniSONE (DELTASONE) 20 MG tablet Take 2 tablets (40 mg total) by mouth daily for 5 days. 10 tablet Rhys Martini, PA-C   nystatin cream (MYCOSTATIN) Apply to affected area 2 times daily 30 g Rhys Martini, PA-C   phenazopyridine (PYRIDIUM) 95 MG tablet Take 1 tablet (95 mg total) by mouth 3 (three) times daily as needed  for pain. 10 tablet Rhys Martini, PA-C     PDMP not reviewed this encounter.   Rhys Martini, PA-C 05/20/20 1000    Rhys Martini, PA-C 05/20/20 1002    Rhys Martini, PA-C 05/20/20 1003

## 2020-05-20 NOTE — ED Triage Notes (Signed)
[place] Peers: started 6 days ago.  Reports fever and cough for a week, reported as 101.2 at one point.  Coughing up phlegm, yellow phlegm  Uti: uncomfortable and feels like she needs to urinate all the time.  Patient has lower back pain.

## 2020-05-20 NOTE — Discharge Instructions (Addendum)
-  For your UTI, start the antibiotic (Macrobid). Take two pills a day (one in the morning, one in the evening) for 5 days. You can also take the azo (phenazopyridine) as needed for urinary discomfort.  -For your bronchitis, take two pills of prednisone in the morning x5 days. Use tessalon as needed for cough.  -For your yeast rash, use the Nystatin cream 1-2x daily for 1-2 weeks.   We are currently awaiting result of your PCR covid-19 test. This typically comes back in 1-2 days. We'll call you if the result is positive. Otherwise, the result will be sent electronically to your MyChart. You can also call this clinic and ask for your result via telephone.   Please isolate at home while awaiting these results. If your test is positive for Covid-19, continue to isolate at home for 5 days if you have mild symptoms, or a total of 10 days from symptom onset if you have more severe symptoms. If you quarantine for a shorter period of time (i.e. 5 days), make sure to wear a mask until day 10 of symptoms. Treat your symptoms at home with OTC remedies like tylenol/ibuprofen, mucinex, nyquil, etc. Seek medical attention if you develop high fevers, chest pain, shortness of breath, ear pain, facial pain, etc. Make sure to get up and move around every 2-3 hours while convalescing to help prevent blood clots. Drink plenty of fluids, and rest as much as possible.

## 2020-05-22 LAB — URINE CULTURE: Culture: >=100000 — AB
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
## 2021-12-13 ENCOUNTER — Ambulatory Visit (HOSPITAL_COMMUNITY): Payer: Medicaid Other | Admitting: Mental Health

## 2021-12-16 ENCOUNTER — Encounter (HOSPITAL_COMMUNITY): Payer: Self-pay

## 2021-12-16 ENCOUNTER — Ambulatory Visit (INDEPENDENT_AMBULATORY_CARE_PROVIDER_SITE_OTHER): Payer: Medicaid Other | Admitting: Psychiatry

## 2021-12-16 ENCOUNTER — Encounter (HOSPITAL_COMMUNITY): Payer: Self-pay | Admitting: Mental Health

## 2021-12-16 ENCOUNTER — Ambulatory Visit (INDEPENDENT_AMBULATORY_CARE_PROVIDER_SITE_OTHER): Payer: Medicaid Other | Admitting: Mental Health

## 2021-12-16 VITALS — BP 139/90 | HR 65 | Wt 215.1 lb

## 2021-12-16 DIAGNOSIS — F3110 Bipolar disorder, current episode manic without psychotic features, unspecified: Secondary | ICD-10-CM

## 2021-12-16 DIAGNOSIS — F411 Generalized anxiety disorder: Secondary | ICD-10-CM

## 2021-12-16 DIAGNOSIS — F4310 Post-traumatic stress disorder, unspecified: Secondary | ICD-10-CM

## 2021-12-16 DIAGNOSIS — F3181 Bipolar II disorder: Secondary | ICD-10-CM

## 2021-12-16 DIAGNOSIS — F1410 Cocaine abuse, uncomplicated: Secondary | ICD-10-CM

## 2021-12-16 DIAGNOSIS — F1121 Opioid dependence, in remission: Secondary | ICD-10-CM | POA: Insufficient documentation

## 2021-12-16 DIAGNOSIS — F1210 Cannabis abuse, uncomplicated: Secondary | ICD-10-CM | POA: Insufficient documentation

## 2021-12-16 MED ORDER — QUETIAPINE FUMARATE 50 MG PO TABS: 50.0000 mg | ORAL_TABLET | Freq: Every day | ORAL | 1 refills | Status: DC

## 2021-12-16 MED ORDER — HYDROXYZINE PAMOATE 25 MG PO CAPS: 25.0000 mg | ORAL_CAPSULE | Freq: Three times a day (TID) | ORAL | 1 refills | Status: AC | PRN

## 2021-12-16 MED ORDER — PRAZOSIN HCL 1 MG PO CAPS: 1.0000 mg | ORAL_CAPSULE | Freq: Every day | ORAL | 1 refills | Status: AC

## 2021-12-16 NOTE — Patient Instructions (Signed)
Follow-up in 3 weeks

## 2021-12-16 NOTE — Plan of Care (Signed)
  Problem: Bipolar Disorder CCP Problem  1 Bipolar dx Goal: LTG: Stabilize mood and increase goal-directed behavior: Input needed on appropriate metric Outcome: Initial Goal: STG: Ricka WILL IDENTIFY COGNITIVE PATTERNS AND BELIEFS THAT INTERFERE WITH THERAPY Outcome: Initial   Problem: PTSD-Trauma Disorder CCP Problem  1 Trauma Goal: LTG: Reduce frequency, intensity, and duration of PTSD symptoms so daily functioning is improved:   Patient will score less than 10 on the PCL5-  weekly  Outcome: Initial Goal: STG: Rameen WILL PRACTICE EMOTION REGULATION SKILLS 5 times PER WEEK FOR THE NEXT 12 WEEKS Outcome: Initial
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
## 2021-12-30 ENCOUNTER — Ambulatory Visit (INDEPENDENT_AMBULATORY_CARE_PROVIDER_SITE_OTHER): Payer: Medicaid Other | Admitting: Mental Health

## 2021-12-30 ENCOUNTER — Encounter (HOSPITAL_COMMUNITY): Payer: Self-pay

## 2021-12-30 ENCOUNTER — Telehealth (HOSPITAL_COMMUNITY): Payer: Medicaid Other | Admitting: Psychiatry

## 2021-12-30 DIAGNOSIS — F4310 Post-traumatic stress disorder, unspecified: Secondary | ICD-10-CM

## 2021-12-30 DIAGNOSIS — F3110 Bipolar disorder, current episode manic without psychotic features, unspecified: Secondary | ICD-10-CM | POA: Diagnosis not present

## 2021-12-30 NOTE — Progress Notes (Signed)
THERAPIST PROGRESS NOTE Virtual Visit via Telephone Note  I connected with Susan Duran on 12/30/21 at 11:00 AM EDT by telephone and verified that I am speaking with the correct Duran using two identifiers.  Location: Patient: home Provider: office    I discussed the limitations, risks, security and privacy concerns of performing an evaluation and management service by telephone and the availability of in Duran appointments. I also discussed with the patient that there may be a patient responsible charge related to this service. The patient expressed understanding and agreed to proceed.  I discussed the assessment and treatment plan with the patient. The patient was provided an opportunity to ask questions and all were answered. The patient agreed with the plan and demonstrated an understanding of the instructions.   The patient was advised to call back or seek an in-Duran evaluation if the symptoms worsen or if the condition fails to improve as anticipated.  I provided 40 minutes of non-face-to-face time during this encounter.   Session Time: 11:12am ( 40 minutes)   Participation Level: Active  Behavioral Response: NAAlertflat  Type of Therapy: Individual Therapy  Treatment Goals addressed: STG: Orine will decrease sxs of depression AEB development of x 3 effective coping skills with ability to reframe distorted thoughts 7/7 days within the next x 6 months STG: [name] will increase management of anxiety and trauma related sxs AEB engagement and practice of x 3 grounding skills and x 3 relaxation skills within the next 6 months ProgressTowards Goals: Not Progressing  Interventions: CBT  Summary: Susan Duran is a 36 y.o. female who presents with hx of bipolar, PTSD, cannabis use, stimulant and opioiod use in early remission. Wonder is engaged and receptive to interventions. Thought process clear and coherent, speech at normal rate and tone. Corley shares ongoing concerns for depressin and  anxiety with nightmares occurring. Shares to have started medication with little improvement thus far. Shares increase in ability to sleep. Shares main concern for finances and looking to obtain work. Shares lack of stability in mental health functioning and prevented abiity to maintain employment in the past. [name] shares feelings of low mood related to inability to obtain oldest son something for his 17th birthday due to lacking finances. Able to process with clinician holding good relationship with son and feels he is understanding. Reports working to try to think more positive. 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 Receptive to clinician education on connection of thoughts, feelings and behaviors and education on types of distorted thinking styles. Identifies with black and white, over generalizing, mental filtering. Shares anxiety attacks continuing to occur several times weekly with at times trigger being clear and other times to feel "It came out of no where." Shares breathes with head between legs that can be helpful at times. Receptive of deep breathing for counts of 4 to support in managing anxiety attacks. Agrees to practice deep breathing and logging frequency and what/where for anxiety attacks. No safety concerns reported.    Suicidal/Homicidal: Nowithout intent/plan  Therapist Response:  Therapist engaged Susan Duran in telephonic therapy session. Assessed for ability to hold confidential session. Completed check in and assessed for current levels of functioning, sxs management and stressors. Provided safe space for Susan Duran to share thoughts and feelings. Provided supportive feedback; validated feelings. Active empathic listening. Encouraged [name] to discuss concerns with mediations to provider. Explored use of previous developed coping skills and provided education on connection of thoughts feelings  and behaviors on mood and level of functioning.  Engaged in psycho education on maladaptive thinking patterns and explored ways distorted thoughts effect functioning. Assessed frequency and intensity of anxiety attacked encouraged to engage in paced breathing for increased coping and encouraged to work to track frequency of anxiety attacks and log to support in identification of triggers. Reviewed session, reviewed treatment plan. Assessed for safety.   Plan: Return again in x 1 weeks.  Diagnosis: Bipolar I disorder, most recent episode (or current) manic (HCC)  PTSD (post-traumatic stress disorder)  Collaboration of Care: Other None  Patient/Guardian was advised Release of Information must be obtained prior to any record release in order to collaborate their care with an outside provider. Patient/Guardian was advised if they have not already done so to contact the registration department to sign all necessary forms in order for [place] to release information regarding their care.   Consent: Patient/Guardian gives verbal consent for treatment and assignment of benefits for services provided during this visit. Patient/Guardian expressed understanding and agreed to proceed.   Stephan Minister [place], [facility] 12/30/2021

## 2021-12-30 NOTE — Plan of Care (Signed)
  Problem: Bipolar Disorder CCP Problem  1 Bipolar dx Description: Susan Duran is a 36 year old [place] female who presents for assessment for engagement in outpatient services. [name] shares to have been diagnosed with bipolar disorder around the age of 32. Shares history of also being diagnosed with PTSD as a teenage. Shares history of being in foster care and was diagnosed with PTSD at the age of 83. Shares last manic episode was about x 3 weeks ago. Shares ongoing concern for PTSD sxs with occurrence of nightmares nightly. Goal: LTG: Stabilize mood and increase goal-directed behavior: Input needed on appropriate metric Outcome: Not Applicable Updated to: LTG: Increase stability in moods AEB decreased PHQ scores equal to or less than x 5 Goal: STG: [name] WILL IDENTIFY COGNITIVE PATTERNS AND BELIEFS THAT INTERFERE WITH THERAPY Outcome: Not Applicable Updated to: STG: [name] will decrease sxs of depression AEB development of x 3 effective coping skills with ability to reframe distorted thoughts 7/7 days within the next x 6 months   Problem: PTSD-Trauma Disorder CCP Problem  1 Trauma Goal: STG: [name] WILL PRACTICE EMOTION REGULATION SKILLS 5 times PER WEEK FOR THE NEXT 12 WEEKS Outcome: Not Applicable Updated to: STG: [name] will increase management of anxiety and trauma related sxs AEB engagement and practice of x 3 grounding skills and x 3 relaxation skills within the next 6 months

## 2022-01-06 ENCOUNTER — Encounter (HOSPITAL_COMMUNITY): Payer: Self-pay

## 2022-01-06 ENCOUNTER — Ambulatory Visit (HOSPITAL_COMMUNITY): Payer: Medicaid Other | Admitting: Mental Health

## 2022-01-06 DIAGNOSIS — F3110 Bipolar disorder, current episode manic without psychotic features, unspecified: Secondary | ICD-10-CM

## 2022-01-06 NOTE — Progress Notes (Signed)
Therapist sent link for video session x 2 with no response via caregility. Therapist contacted Yina via telephone to attempt telephonic session with phone going straight to voicemail.   Left message to reschedule appointment.

## 2022-01-13 ENCOUNTER — Other Ambulatory Visit (HOSPITAL_COMMUNITY): Payer: Self-pay | Admitting: Psychiatry

## 2022-01-13 DIAGNOSIS — F3181 Bipolar II disorder: Secondary | ICD-10-CM

## 2022-01-13 MED ORDER — QUETIAPINE FUMARATE 50 MG PO TABS: 50.0000 mg | ORAL_TABLET | Freq: Every day | ORAL | 1 refills | Status: AC

## 2022-01-20 ENCOUNTER — Encounter (HOSPITAL_COMMUNITY): Payer: Medicaid Other | Admitting: Psychiatry

## 2023-11-06 ENCOUNTER — Other Ambulatory Visit: Payer: Self-pay

## 2023-11-06 ENCOUNTER — Other Ambulatory Visit (HOSPITAL_COMMUNITY)
Admission: RE | Admit: 2023-11-06 | Discharge: 2023-11-06 | Disposition: A | Discharge status: Home / Self Care | Source: Ambulatory Visit | Attending: Obstetrics and Gynecology | Admitting: Obstetrics and Gynecology

## 2023-11-06 ENCOUNTER — Ambulatory Visit: Admitting: Obstetrics and Gynecology

## 2023-11-06 ENCOUNTER — Encounter: Payer: Self-pay | Admitting: Obstetrics and Gynecology

## 2023-11-06 VITALS — BP 105/80 | HR 85 | Ht 68.0 in | Wt 258.0 lb

## 2023-11-06 DIAGNOSIS — Z113 Encounter for screening for infections with a predominantly sexual mode of transmission: Secondary | ICD-10-CM | POA: Insufficient documentation

## 2023-11-06 DIAGNOSIS — Z124 Encounter for screening for malignant neoplasm of cervix: Secondary | ICD-10-CM | POA: Insufficient documentation

## 2023-11-06 DIAGNOSIS — Z01419 Encounter for gynecological examination (general) (routine) without abnormal findings: Secondary | ICD-10-CM

## 2023-11-06 DIAGNOSIS — Z30432 Encounter for removal of intrauterine contraceptive device: Secondary | ICD-10-CM

## 2023-11-06 DIAGNOSIS — Z1231 Encounter for screening mammogram for malignant neoplasm of breast: Secondary | ICD-10-CM

## 2023-11-06 DIAGNOSIS — Z1151 Encounter for screening for human papillomavirus (HPV): Secondary | ICD-10-CM | POA: Diagnosis not present

## 2023-11-06 NOTE — Progress Notes (Signed)
 ANNUAL EXAM Patient name: Susan Duran MRN [id number]  Date of birth: 1985-10-21 Chief Complaint:   Gynecologic Exam  History of Present Illness:   Susan Duran is a 38 y.o. [id number]  female being seen today for a routine annual exam.  Current complaints: here to establish care. Has had IUD in place for 7 years. Reports abnormal odor, discharge and cramping and desires removal today. Reports that cramping is becoming constant. Denies abnormal bleeding. Does not desire birth control at this time. She has a PCP that she sees regularly.   No LMP recorded. (Menstrual status: IUD).   Upstream - 11/06/23 0902       Pregnancy Intention Screening   Does the patient want to become pregnant in the next year? No    Does the patient's partner want to become pregnant in the next year? No    Would the patient like to discuss contraceptive options today? No      Contraception Wrap Up   Current Method IUD or IUS    End Method No Contraception Precautions    Contraception Counseling Provided No    How was the end contraceptive method provided? N/A         The pregnancy intention screening data noted above was reviewed. Potential methods of contraception were discussed. The patient elected to proceed with No Contraception Precautions.   Gynecologic History Contraception: IUD Last Pap 2018: . Results were: Normal Last mammogram: n/a. Results were:n/a     11/06/2023    8:54 AM 12/16/2021   11:06 AM  Depression screen PHQ 2/9  Decreased Interest 0   Down, Depressed, Hopeless 0   PHQ - 2 Score 0   Altered sleeping 1   Tired, decreased energy 1   Change in appetite 0   Feeling bad or failure about yourself  0   Trouble concentrating 0   Moving slowly or fidgety/restless 0   Suicidal thoughts 0   PHQ-9 Score 2   Difficult doing work/chores       Information is confidential and restricted. Go to Review Flowsheets to unlock data.        11/06/2023    8:54 AM 12/16/2021   11:11 AM  GAD 7 :  Generalized Anxiety Score  Nervous, Anxious, on Edge 0   Control/stop worrying 0   Worry too much - different things 0   Trouble relaxing 0   Restless 0   Easily annoyed or irritable 1   Afraid - awful might happen 0   Total GAD 7 Score 1   Anxiety Difficulty       Information is confidential and restricted. Go to Review Flowsheets to unlock data.     Review of Systems:   Pertinent items are noted in HPI Denies any headaches, blurred vision, fatigue, shortness of breath, chest pain, abdominal pain, abnormal vaginal discharge/itching/odor/irritation, problems with periods, bowel movements, urination, or intercourse unless otherwise stated above. Pertinent History Reviewed:  Reviewed past medical,surgical, social and family history.  Reviewed problem list, medications and allergies. Physical Assessment:   Vitals:   11/06/23 0853  BP: 105/80  Pulse: 85  Weight: 258 lb (117 kg)  Height: 5' 8 (1.727 m)  Body mass index is 39.23 kg/m.        Physical Examination:   General appearance - well appearing, and in no distress  Mental status - alert, oriented   Psych:  She has a normal mood and affect  Skin - warm and dry, normal color  Chest - effort normal, all lung fields clear to auscultation bilaterally  Heart - normal rate and regular rhythm  Neck:  midline trachea  Breasts - breasts appear normal, no suspicious masses, no skin or nipple changes or  axillary nodes  Abdomen - soft, suprpubic tenderness  Pelvic - VULVA: normal appearing vulva with no masses, tenderness or lesions  VAGINA: normal appearing vagina with normal color and discharge, no lesions  CERVIX: normal appearing cervix without discharge or lesions, no CMT  Thin prep pap is done w HR HPV cotesting  UTERUS: uterus is felt to be normal size, shape, consistency and nontender   ADNEXA: No adnexal masses or tenderness noted.  Extremities:  No swelling or varicosities noted  Chaperone present for exam  IUD  Removal  Patient identified, informed consent performed, consent signed.  Patient was in the dorsal lithotomy position, normal external genitalia was noted.  A speculum was placed in the patient's vagina, normal discharge was noted, no lesions. The cervix was visualized, no lesions, no abnormal discharge.  The strings of the IUD were grasped and pulled using ring forceps. The IUD was removed in its entirety. Patient tolerated the procedure well.    Patient will use no contraception Routine preventative health maintenance measures emphasized.  Assessment & Plan:  1. Encounter for annual routine gynecological examination (Primary) Pap collected today Schedule mammogram  PCP manages HTN  2. Cervical cancer screening  - Cytology - PAP( [place])  3. Encounter for screening mammogram for malignant neoplasm of breast  - MM 3D SCREENING MAMMOGRAM BILATERAL BREAST; Future  4. Encounter for IUD removal See removal note above  5. Screen for STD (sexually transmitted disease)  - RPR+HBsAg+HCVAb+... - Cervicovaginal ancillary only   Labs/procedures today:   Mammogram: schedule screening mammo as soon as possible, or sooner if problems Colonoscopy: @ 40yo, or sooner if problems  Orders Placed This Encounter  Procedures   MM 3D SCREENING MAMMOGRAM BILATERAL BREAST   RPR+HBsAg+HCVAb+...    Meds: No orders of the defined types were placed in this encounter.   Follow-up: Return in about 1 year (around 11/05/2024) for Susan Duran Susan Delores, FNP

## 2023-11-07 LAB — CERVICOVAGINAL ANCILLARY ONLY
Bacterial Vaginitis (gardnerella): POSITIVE — AB
Candida Glabrata: NEGATIVE
Candida Vaginitis: NEGATIVE
Chlamydia: NEGATIVE
Comment: NEGATIVE
Comment: NEGATIVE
Comment: NEGATIVE
Comment: NEGATIVE
Comment: NEGATIVE
Comment: NORMAL
Neisseria Gonorrhea: NEGATIVE
Trichomonas: POSITIVE — AB

## 2023-11-07 LAB — RPR+HBSAG+HCVAB+...
HIV Screen 4th Generation wRfx: NONREACTIVE
Hep C Virus Ab: REACTIVE — AB
Hepatitis B Surface Ag: NEGATIVE
RPR Ser Ql: NONREACTIVE

## 2023-11-07 LAB — CYTOLOGY - PAP
Comment: NEGATIVE
Diagnosis: NEGATIVE
High risk HPV: NEGATIVE

## 2023-11-08 ENCOUNTER — Other Ambulatory Visit: Payer: Self-pay

## 2023-11-08 ENCOUNTER — Ambulatory Visit: Payer: Self-pay | Admitting: Obstetrics and Gynecology

## 2023-11-08 DIAGNOSIS — B1920 Unspecified viral hepatitis C without hepatic coma: Secondary | ICD-10-CM

## 2023-11-08 DIAGNOSIS — A599 Trichomoniasis, unspecified: Secondary | ICD-10-CM

## 2023-11-08 MED ORDER — METRONIDAZOLE 500 MG PO TABS: 500.0000 mg | ORAL_TABLET | Freq: Two times a day (BID) | ORAL | 0 refills | Status: AC

## 2023-11-08 NOTE — Telephone Encounter (Signed)
-----   Message from Rosaline Pendleton, RN sent at 11/08/2023 11:55 AM EDT -----   ----- Message ----- From: Delores Nidia CROME, FNP Sent: 11/08/2023  10:30 AM EDT To: [facility] Clinical Pool  Trich and bv on swab, treatment sent to pharmacy. Hep C antibody positive, can we see if she can come in for lab only visit to check HCV RNA to evaluate acute new infection vs chronic?  ----- Message ----- From: Interface, [facility] Lab Results In Sent: 11/07/2023   5:37 AM EDT To: Nidia CROME Delores, FNP

## 2023-11-08 NOTE — Telephone Encounter (Signed)
 Called patient regarding recent positive lab results of Trichomonas and BV. Notified patient antibiotics has been sent to pharmacy. Advised patient for partner(s) to be tested and treated at their PCP, urgent care or [facility] and to avoid unprotected intercourse 7 days after treatment. Informed to take medication with food and to avoid alcohol. Hep C antibody came back positive and asked if she had f/u in the past. Patient reported that she was believed to be possibly exposed in the past but would like to get labs to check HCV RNA. Notified patient medications is ready for pick up. All questions answered and patient denies any other needs at this time.  Patient scheduled for lab visit on 11/14/23 at 0930.   Rosaline, RN

## 2023-11-08 NOTE — Telephone Encounter (Addendum)
 Attempted to call patient x2 regarding recent labs that was done in office. Left VM for patient to call our office back regarding recent lab results.  Rosaline, RN ----- Message from Nidia Daring sent at 11/08/2023 10:30 AM EDT ----- Dominga and bv on swab, treatment sent to pharmacy. Hep C antibody positive, can we see if she can come in for lab only visit to check HCV RNA to evaluate acute new infection vs chronic?  ----- Message ----- From: Interface, [facility] Lab Results In Sent: 11/07/2023   5:37 AM EDT To: Nidia LITTIE Daring, FNP

## 2023-11-13 ENCOUNTER — Ambulatory Visit

## 2023-11-14 ENCOUNTER — Other Ambulatory Visit: Payer: Self-pay

## 2023-11-14 ENCOUNTER — Other Ambulatory Visit

## 2023-11-14 DIAGNOSIS — B1920 Unspecified viral hepatitis C without hepatic coma: Secondary | ICD-10-CM

## 2023-11-15 LAB — HCV RNA QUANT: Hepatitis C Quantitation: NOT DETECTED [IU]/mL

## 2023-11-17 ENCOUNTER — Ambulatory Visit: Payer: Self-pay | Admitting: Obstetrics and Gynecology
